# Patient Record
Sex: Male | Born: 1990 | Race: White | Hispanic: Yes | Marital: Single | Smoking: Former smoker
Health system: Southern US, Community
[De-identification: ages and names within clinical notes are randomized; demographics above are authoritative.]

---
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
## 2012-10-28 DIAGNOSIS — R079 Chest pain, unspecified: Secondary | ICD-10-CM

## 2021-03-03 ENCOUNTER — Emergency Department (HOSPITAL_COMMUNITY)
Admission: EM | Admit: 2021-03-03 | Discharge: 2021-03-04 | Disposition: A | Discharge status: Home / Self Care | Payer: Self-pay | Attending: Emergency Medicine | Admitting: Emergency Medicine

## 2021-03-03 ENCOUNTER — Other Ambulatory Visit: Payer: Self-pay

## 2021-03-03 ENCOUNTER — Encounter (HOSPITAL_COMMUNITY): Payer: Self-pay

## 2021-03-03 DIAGNOSIS — M545 Low back pain, unspecified: Secondary | ICD-10-CM | POA: Insufficient documentation

## 2021-03-03 DIAGNOSIS — B349 Viral infection, unspecified: Secondary | ICD-10-CM | POA: Insufficient documentation

## 2021-03-03 DIAGNOSIS — Z28310 Unvaccinated for covid-19: Secondary | ICD-10-CM | POA: Insufficient documentation

## 2021-03-03 DIAGNOSIS — Z87891 Personal history of nicotine dependence: Secondary | ICD-10-CM | POA: Insufficient documentation

## 2021-03-03 DIAGNOSIS — Z20822 Contact with and (suspected) exposure to covid-19: Secondary | ICD-10-CM | POA: Insufficient documentation

## 2021-03-03 LAB — BASIC METABOLIC PANEL
Anion gap: 8 (ref 5–15)
BUN: 13 mg/dL (ref 6–20)
CO2: 26 mmol/L (ref 22–32)
Calcium: 8.7 mg/dL — ABNORMAL LOW (ref 8.9–10.3)
Chloride: 101 mmol/L (ref 98–111)
Creatinine, Ser: 0.88 mg/dL (ref 0.61–1.24)
GFR, Estimated: >60 mL/min (ref >60)
Glucose, Bld: 98 mg/dL (ref 70–99)
Potassium: 3.6 mmol/L (ref 3.5–5.1)
Sodium: 135 mmol/L (ref 135–145)

## 2021-03-03 LAB — CBC WITH DIFFERENTIAL/PLATELET
Abs Immature Granulocytes: 0.03 10*3/uL (ref 0.00–0.07)
Basophils Absolute: 0 10*3/uL (ref 0.0–0.1)
Basophils Relative: 0 %
Eosinophils Absolute: 0 10*3/uL (ref 0.0–0.5)
Eosinophils Relative: 1 %
HCT: 43.3 % (ref 39.0–52.0)
Hemoglobin: 14.5 g/dL (ref 13.0–17.0)
Immature Granulocytes: 0 %
Lymphocytes Relative: 23 %
Lymphs Abs: 2 10*3/uL (ref 0.7–4.0)
MCH: 29.8 pg (ref 26.0–34.0)
MCHC: 33.5 g/dL (ref 30.0–36.0)
MCV: 88.9 fL (ref 80.0–100.0)
Monocytes Absolute: 1.4 10*3/uL — ABNORMAL HIGH (ref 0.1–1.0)
Monocytes Relative: 16 %
Neutro Abs: 5.2 10*3/uL (ref 1.7–7.7)
Neutrophils Relative %: 60 %
Platelets: 185 10*3/uL (ref 150–400)
RBC: 4.87 MIL/uL (ref 4.22–5.81)
RDW: 11.9 % (ref 11.5–15.5)
WBC: 8.6 10*3/uL (ref 4.0–10.5)
nRBC: 0 % (ref 0.0–0.2)

## 2021-03-03 LAB — LIPASE, BLOOD: Lipase: 30 U/L (ref 11–51)

## 2021-03-03 LAB — RESP PANEL BY RT-PCR (FLU A&B, COVID) ARPGX2
Influenza A by PCR: NEGATIVE
Influenza B by PCR: NEGATIVE
SARS Coronavirus 2 by RT PCR: NEGATIVE

## 2021-03-03 LAB — URINALYSIS, ROUTINE W REFLEX MICROSCOPIC
Bilirubin Urine: NEGATIVE
Glucose, UA: NEGATIVE mg/dL
Hgb urine dipstick: NEGATIVE
Ketones, ur: NEGATIVE mg/dL
Leukocytes,Ua: NEGATIVE
Nitrite: NEGATIVE
Protein, ur: NEGATIVE mg/dL
Specific Gravity, Urine: 1.023 (ref 1.005–1.030)
pH: 6 (ref 5.0–8.0)

## 2021-03-03 NOTE — ED Provider Notes (Signed)
°  Provider Note MRN:  [id number]  Arrival date & time: 03/04/21    ED Course and Medical Decision Making  Assumed care from Dr. Effie Shy at shift change.  URI symptoms as well as flank pain, favoring MSK but will evaluate with urinalysis, labs.  Would consider CT imaging for stone/Pilo.  Work-up is very reassuring, no leukocytosis, urinalysis is normal, based on history does seem most consistent with MSK.  Was gradual onset, doubt kidney stone, no blood in the urine, no signs of infection.  Patient is appropriate for discharge with reassurance.  Procedures  Final Clinical Impressions(s) / ED Diagnoses     ICD-10-CM   1. Acute right-sided low back pain without sciatica  M54.50     2. Viral illness  B34.9       ED Discharge Orders     None         Discharge Instructions      You were evaluated in the Emergency Department and after careful evaluation, we did not find any emergent condition requiring admission or further testing in the hospital.  Your exam/testing today was overall reassuring.  Symptoms seem to be due to a viral illness.  Recommend continued use of Tylenol or Motrin at home for discomfort.  Suspect she will start feeling better within the next 3 to 5 days.  Please return to the Emergency Department if you experience any worsening of your condition.  Thank you for allowing [place] to be a part of your care.       Elmer Sow. Pilar Plate, MD [facility] Emergency Medicine [facility] [email or web address]    Sabas Sous, MD 03/04/21 Lyda Jester

## 2021-03-03 NOTE — ED Triage Notes (Signed)
Pt arrived via POV from home c/o right flank pain and low grade fever at home X 1 week. Pt reports highest temp at home was 106F. Pts temp in Triage is 100.6F. Pt reports last taking Tylenol earlier this morning PTA. Pt reports flank pain is localized and non-radiating.

## 2021-03-03 NOTE — ED Provider Notes (Signed)
[facility] EMERGENCY DEPARTMENT Provider Note   CSN: [id number] Arrival date & time: 03/03/21  2047     History Chief Complaint  Patient presents with   Flank Pain    ESIQUIO [facility] is a 30 y.o. male.  HPI  Patient without medical history presents with chief complaint of right-sided lower back pain and fevers.  Patient states this started 1 week ago, started on Monday,  develop some right lower back pain, pain does not radiate, describes it as a dull-like sensation pain is worsened with movement improved with rest, he has no associate paresthesia/weakness lower extremities, denies urinary incontinency, urinary retention or difficulty with bowel movements, he does note that he has been peeing more frequently but denies dysuria hematuria or stomach pain.  Patient denies  history of kidney stones, no stomach surgeries or significant abdominal history.  He also notes that he started to have some nausea and vomiting that started on Monday, states that he only vomited  one time has not vomited since, he denies hematemesis or coffee-ground emesis, he states that he has felt slightly nauseous and has a decreased appetite but is still tolerating p.o.  He states that he has been having some fevers, chills, nasal congestion, a slightly sore throat and a productive cough, he denies  general body aches.  He is not immunocompromise, is not vaccine against COVID or influenza, denies any recent sick contacts, states in taking Tylenol with some relief.  History reviewed. No pertinent past medical history.  There are no problems to display for this patient.   History reviewed. No pertinent surgical history.     History reviewed. No pertinent family history.  Social History   Tobacco Use   Smoking status: Former    Types: Cigarettes   Smokeless tobacco: Never  Vaping Use   Vaping Use: Never used  Substance Use Topics   Alcohol use: Not Currently   Drug use: Not Currently    Home  Medications Prior to Admission medications   Not on File    Allergies    Patient has no allergy information on record.  Review of Systems   Review of Systems  Constitutional:  Positive for appetite change and chills. Negative for fever.  HENT:  Positive for congestion and sore throat.   Respiratory:  Positive for cough. Negative for shortness of breath.   Cardiovascular:  Negative for chest pain.  Gastrointestinal:  Positive for nausea. Negative for abdominal pain, constipation, diarrhea and vomiting.  Genitourinary:  Negative for enuresis.  Musculoskeletal:  Positive for back pain.  Skin:  Negative for rash.  Neurological:  Negative for dizziness and headaches.  Hematological:  Does not bruise/bleed easily.   Physical Exam Updated Vital Signs BP 125/82    Pulse 93    Temp 100 F (37.8 C) (Oral)    Resp 17    Ht 5\' 11"  (1.803 m)    Wt 95.3 kg    SpO2 99%    BMI 29.29 kg/m   Physical Exam Vitals and nursing note reviewed.  Constitutional:      General: He is not in acute distress.    Appearance: He is not ill-appearing.  HENT:     Head: Normocephalic and atraumatic.     Nose: Congestion present.     Mouth/Throat:     Mouth: Mucous membranes are moist.     Pharynx: Oropharynx is clear. No oropharyngeal exudate or posterior oropharyngeal erythema.  Eyes:     Conjunctiva/sclera: Conjunctivae normal.  Cardiovascular:     Rate and Rhythm: Normal rate and regular rhythm.     Pulses: Normal pulses.     Heart sounds: No murmur heard.   No friction rub. No gallop.  Pulmonary:     Effort: No respiratory distress.     Breath sounds: No wheezing, rhonchi or rales.  Abdominal:     Palpations: Abdomen is soft.     Tenderness: There is no abdominal tenderness. There is no right CVA tenderness or left CVA tenderness.  Musculoskeletal:     Comments: Spine was palpated was nontender to palpation, no step-off or deformities present, patient had very slight tenderness within the  musculature below his right 12th rib, no overlying skin changes, no CVA tenderness.  Patient has full range of motion, 5 of 5 strength, neurovascularly intact in lower extremities, negative straight leg raise.  Patient ambulating without difficulty.  Skin:    General: Skin is warm and dry.  Neurological:     Mental Status: He is alert.  Psychiatric:        Mood and Affect: Mood normal.    ED Results / Procedures / Treatments   Labs (all labs ordered are listed, but only abnormal results are displayed) Labs Reviewed  BASIC METABOLIC PANEL - Abnormal; Notable for the following components:      Result Value   Calcium 8.7 (*)    All other components within normal limits  CBC WITH DIFFERENTIAL/PLATELET - Abnormal; Notable for the following components:   Monocytes Absolute 1.4 (*)    All other components within normal limits  RESP PANEL BY RT-PCR (FLU A&B, COVID) [id number]  URINALYSIS, ROUTINE W REFLEX MICROSCOPIC  LIPASE, BLOOD    EKG None  Radiology No results found.  Procedures Procedures   Medications Ordered in ED Medications - No data to display  ED Course  I have reviewed the triage vital signs and the nursing notes.  Pertinent labs & imaging results that were available during my care of the patient were reviewed by me and considered in my medical decision making (see chart for details).    MDM Rules/Calculators/A&P                         Initial impression-presents with right-sided back pain as well as fevers and chills.  He is alert, no acute stress, vital signs notable for tachycardia.  Unclear etiology likely 2 separate etiologies muscular strain as well as URI will obtain basic lab work UA and reassess.  Work-up-CBC, lipase, BMP, UA all pending at this time.  Plan-due to shift change patient will be handed off to Dr. Pilar Plate  Follow-up on lab work, as long as UA is negative for signs infection or kidney stones, no increases creatinine and/or CBC will not recommend  CT renal at this time as I feel back pain is more muscular in nature.  Nausea vomiting fevers seem more consistent with URI, as long as patient remains nontoxic no leukocytosis, no abnormality seen in chemistries and is tolerating p.o. patient can be discharged home without further imaging.  If all negative patient can be discharged home with supportive measures follow-up if needed.     Final Clinical Impression(s) / ED Diagnoses Final diagnoses:  Acute right-sided low back pain without sciatica    Rx / DC Orders ED Discharge Orders     None        Barnie Del 03/03/21 2343    Mancel Bale, MD  03/04/21 1735 ° °

## 2021-03-04 NOTE — Discharge Instructions (Signed)
You were evaluated in the Emergency Department and after careful evaluation, we did not find any emergent condition requiring admission or further testing in the hospital.  Your exam/testing today was overall reassuring.  Symptoms seem to be due to a viral illness.  Recommend continued use of Tylenol or Motrin at home for discomfort.  Suspect she will start feeling better within the next 3 to 5 days.  Please return to the Emergency Department if you experience any worsening of your condition.  Thank you for allowing [place] to be a part of your care.

## 2022-08-11 ENCOUNTER — Other Ambulatory Visit: Payer: Self-pay

## 2022-08-11 ENCOUNTER — Emergency Department (HOSPITAL_COMMUNITY)
Admission: EM | Admit: 2022-08-11 | Discharge: 2022-08-11 | Disposition: A | Discharge status: Home / Self Care | Payer: Self-pay | Attending: Emergency Medicine | Admitting: Emergency Medicine

## 2022-08-11 ENCOUNTER — Encounter (HOSPITAL_COMMUNITY): Payer: Self-pay | Admitting: Emergency Medicine

## 2022-08-11 ENCOUNTER — Emergency Department (HOSPITAL_COMMUNITY): Payer: Self-pay

## 2022-08-11 DIAGNOSIS — M79642 Pain in left hand: Secondary | ICD-10-CM | POA: Insufficient documentation

## 2022-08-11 LAB — CBC WITH DIFFERENTIAL/PLATELET
Abs Immature Granulocytes: 0.03 10*3/uL (ref 0.00–0.07)
Basophils Absolute: 0 10*3/uL (ref 0.0–0.1)
Basophils Relative: 0 %
Eosinophils Absolute: 0.1 10*3/uL (ref 0.0–0.5)
Eosinophils Relative: 1 %
HCT: 47.9 % (ref 39.0–52.0)
Hemoglobin: 16.2 g/dL (ref 13.0–17.0)
Immature Granulocytes: 0 %
Lymphocytes Relative: 22 %
Lymphs Abs: 2.2 10*3/uL (ref 0.7–4.0)
MCH: 29.9 pg (ref 26.0–34.0)
MCHC: 33.8 g/dL (ref 30.0–36.0)
MCV: 88.5 fL (ref 80.0–100.0)
Monocytes Absolute: 0.7 10*3/uL (ref 0.1–1.0)
Monocytes Relative: 7 %
Neutro Abs: 6.9 10*3/uL (ref 1.7–7.7)
Neutrophils Relative %: 70 %
Platelets: 213 10*3/uL (ref 150–400)
RBC: 5.41 MIL/uL (ref 4.22–5.81)
RDW: 12 % (ref 11.5–15.5)
WBC: 9.9 10*3/uL (ref 4.0–10.5)
nRBC: 0 % (ref 0.0–0.2)

## 2022-08-11 LAB — BASIC METABOLIC PANEL
Anion gap: 13 (ref 5–15)
BUN: 9 mg/dL (ref 6–20)
CO2: 22 mmol/L (ref 22–32)
Calcium: 9.2 mg/dL (ref 8.9–10.3)
Chloride: 99 mmol/L (ref 98–111)
Creatinine, Ser: 0.89 mg/dL (ref 0.61–1.24)
GFR, Estimated: >60 mL/min (ref >60)
Glucose, Bld: 103 mg/dL — ABNORMAL HIGH (ref 70–99)
Potassium: 4.1 mmol/L (ref 3.5–5.1)
Sodium: 134 mmol/L — ABNORMAL LOW (ref 135–145)

## 2022-08-11 LAB — TROPONIN I (HIGH SENSITIVITY): Troponin I (High Sensitivity): <2 ng/L (ref <18)

## 2022-08-11 NOTE — Discharge Instructions (Signed)
Please follow-up with your primary care provider regarding recent symptoms and ER visit.  I have attached a primary care provider for you to follow-up with if you do not have one.  Today your labs and imaging were all reassuring. Please monitor your symptoms and if symptoms worsen please return to ER.

## 2022-08-11 NOTE — ED Notes (Signed)
PA-C would like troponin drawn, phlebotomy called and phlebotomist stated that they will come and draw lab

## 2022-08-11 NOTE — ED Notes (Signed)
Called phlebotomy pertaining to troponin, it has been collected and they are running it, awaiting results

## 2022-08-11 NOTE — ED Triage Notes (Signed)
Pt reporting left hand numbness intermittently for several months with occasional radiation up to left pectoral area. Pt reports family hx of heart problems. No personal significant medical history. 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

## 2022-08-11 NOTE — ED Provider Notes (Addendum)
[facility] Provider Note   CSN: [id number] Arrival date & time: 08/11/22  1557     History  Chief Complaint  Patient presents with   Hand Problem    [facility] is a 32 y.o. male with no past medical problems presented with left hand pain that has been present for the past year.  Patient states that he does work in [employer] his first 3 digits on his left fingers go numb for moments at a time.  Patient also notes this when he wakes up in the morning.  Patient states he was drinking alcohol last night and woke up feeling hung over and at the same paresthesias.  Patient denies any weakness, decreased sensation.  Patient does state he is concerned that his hand is not perfusing as his dad recently had heart surgery and he is concerned for the same.  Patient states that he notices paresthesias rating from his left elbow to his first left digits and sometimes from his left shoulder.  Patient does note that he sleeps on his left side a lot.  Patient denies chest pain, shortness of breath, and neck trauma/neck pain  Home Medications Prior to Admission medications   Not on File      Allergies    Patient has no known allergies.    Review of Systems   Review of Systems See HPI Physical Exam Updated Vital Signs BP (!) 148/90 (BP Location: Right Arm)   Pulse 78   Temp 98.8 F (37.1 C) (Oral)   Resp 13   Ht 5\' 11"  (1.803 m)   Wt 95.3 kg   SpO2 100%   BMI 29.29 kg/m  Physical Exam Constitutional:      General: He is not in acute distress. Cardiovascular:     Rate and Rhythm: Normal rate and regular rhythm.     Pulses: Normal pulses.     Heart sounds: Normal heart sounds.     Comments: 2+ bilateral radial pulses with regular rate Pulmonary:     Effort: Pulmonary effort is normal. No respiratory distress.     Breath sounds: Normal breath sounds.  Musculoskeletal:     Cervical back: Normal range of motion and neck  supple. No tenderness.     Comments: Left wrist: 5 out of 5 wrist flexion/extension grip, elbow flexion/extension No step-off/crepitus/or mass palpated Negative Tinel's/Phalen sign No swelling noted Arms do not appear to be edematous Soft compartments  Skin:    General: Skin is warm and dry.     Capillary Refill: Capillary refill takes less than 2 seconds.     Comments: No overlying skin color change  Neurological:     Mental Status: He is alert.     Comments: Sensation intact distally     ED Results / Procedures / Treatments   Labs (all labs ordered are listed, but only abnormal results are displayed) Labs Reviewed  BASIC METABOLIC PANEL - Abnormal; Notable for the following components:      Result Value   Sodium 134 (*)    Glucose, Bld 103 (*)    All other components within normal limits  CBC WITH DIFFERENTIAL/PLATELET  TROPONIN I (HIGH SENSITIVITY)  TROPONIN I (HIGH SENSITIVITY)    EKG EKG Interpretation  Date/Time:  Sunday Aug 11 2022 17:21:43 EDT Ventricular Rate:  76 PR Interval:  149 QRS Duration: 103 QT Interval:  382 QTC Calculation: 430 R Axis:   69 Text Interpretation: Sinus rhythm  ST elev, probable normal early repol pattern No acute changes No significant change since last tracing Confirmed by Derwood Kaplan [phone]) on 08/11/2022 6:22:16 PM  Radiology DG Chest Port 1 View  Result Date: 08/11/2022 CLINICAL DATA:  Chest pain. EXAM: PORTABLE CHEST 1 VIEW COMPARISON:  None Available. FINDINGS: The heart size and mediastinal contours are within normal limits. Both lungs are clear. The visualized skeletal structures are unremarkable. IMPRESSION: No active disease. Electronically Signed   By: Ted Mcalpine M.D.   On: 08/11/2022 17:44    Procedures Procedures    Medications Ordered in ED Medications - No data to display  ED Course/ Medical Decision Making/ A&P                             Medical Decision Making Amount and/or Complexity of Data  Reviewed Labs: ordered. Radiology: ordered.   [facility] 33 y.o. presented today for left hand paresthesias. Working DDx that I considered at this time includes, but not limited to, peripheral neuropathy, nerve palsy, DVT, arterial emboli, fracture, compartment syndrome, neurovascular compromise, ACS, asthma, PE.  R/o DDx: DVT, arterial emboli, fracture, compartment syndrome, neurovascular compromise, ACS, asthma, PE: These are considered less likely due to history of present illness and physical exam findings  Review of prior external notes: 03/03/2021 ED  Unique Tests and My Interpretation:  CBC: Unremarkable BMP: Unremarkable Chest x-ray: No acute cardiopulmonary changes Troponin: Less than 2 EKG: sinus without blocks or ST abnormalities noted  Discussion with Independent Historian: None  Discussion of Management of Tests: None  Risk: Low: based on diagnostic testing/clinical impression and treatment plan  Risk Stratification Score: PERC 0  Plan: Patient presented for left hand paresthesia. On exam patient was in no acute distress and stable vitals.  Patient's physical exam was unremarkable.  Patient is concerned that his hand is not perfusing well however he demonstrated that his capillary refills less than 2 seconds.  Patient notes that he works in [employer] and often uses machines with a lot of vibration which may be contributing to patient's symptoms.  Patient also notes that he sleeps on his left side a lot and that he notices paresthesias after he wakes up from naps.  I highly suspect patient has a reversible nerve palsy due to reported when he sleeps and with his overall reassuring exam.  Patient not endorse any chest pain or shortness of breath or any other red flag symptoms.  Patient good pulse motor sensation and good cap refill.  Patient stated they wanted basic blood labs drawn and so BMP and CBC will be ordered.  Anticipate discharge with primary care follow-up.   Patient stable at this time.  Patient's BMP and CBC are reassuring.  I want to go discharge patient patient stated that he was starting to have shortness of breath and that this morning he had chest pain.  Chest x-ray will be obtained along with troponin.  Only 1 troponin will be needed as patient had chest pain this morning and it has been more than 4 hours.  Patient stable this time.  Patient's troponin and chest x-ray are reassuring.  Patient will be given primary care follow-up and discharge.  Patient was given return precautions. Patient stable for discharge at this time.  Patient verbalized understanding of plan.         Final Clinical Impression(s) / ED Diagnoses Final diagnoses:  Left hand pain    Rx / DC  Orders ED Discharge Orders     None        Remi Deter 08/11/22 1851    Derwood Kaplan, MD 08/16/22 [phone]

## 2022-11-13 ENCOUNTER — Ambulatory Visit
Admission: EM | Admit: 2022-11-13 | Discharge: 2022-11-13 | Disposition: A | Discharge status: Home / Self Care | Payer: Medicaid Other | Attending: Family Medicine | Admitting: Family Medicine

## 2022-11-13 DIAGNOSIS — R1084 Generalized abdominal pain: Secondary | ICD-10-CM | POA: Diagnosis not present

## 2022-11-13 DIAGNOSIS — R197 Diarrhea, unspecified: Secondary | ICD-10-CM | POA: Insufficient documentation

## 2022-11-13 MED ORDER — AZITHROMYCIN 250 MG PO TABS: ORAL_TABLET | ORAL | 0 refills | Status: DC

## 2022-11-13 MED ORDER — LOPERAMIDE HCL 2 MG PO CAPS: 2.0000 mg | ORAL_CAPSULE | Freq: Four times a day (QID) | ORAL | 0 refills | Status: DC | PRN

## 2022-11-13 NOTE — ED Triage Notes (Addendum)
Pt c/o abdominal pain and diarrhea x 2 weeks. Stool has been yellow for the past week. Right flank pain.    Pt has brecently been on antibiotics for a bad tooth.

## 2022-11-13 NOTE — ED Provider Notes (Signed)
[facility]    CSN: [id number] Arrival date & time: 11/13/22  1601      History   Chief Complaint No chief complaint on file.   HPI Edgar Parker is a 32 y.o. male.   Patient presenting today with about 2 weeks of generalized abdominal pain, yellow frequent diarrhea which he states he is going at least 3 times daily.  Denies fever, chills, nausea, vomiting, upper respiratory symptoms, new foods, recent travel outside the country, sick contacts.  Recently completed a course of antibiotics after a root canal, these ended about a week and a half ago.  Symptoms started while taking the last few doses of this.  So far not trying anything over-the-counter for the symptoms other than ibuprofen which he stopped wondering if his abdominal pain was related to this.  The symptoms did not improve after stopping.  Denies any known chronic GI issues.    History reviewed. No pertinent past medical history.  There are no problems to display for this patient.   History reviewed. No pertinent surgical history.     Home Medications    Prior to Admission medications   Medication Sig Start Date End Date Taking? Authorizing Provider  azithromycin (ZITHROMAX) 250 MG tablet Take first 2 tablets together, then 1 every day until finished. 11/13/22  Yes Particia Nearing, PA-C  loperamide (IMODIUM) 2 MG capsule Take 1 capsule (2 mg total) by mouth 4 (four) times daily as needed for diarrhea or loose stools. 11/13/22  Yes Particia Nearing, PA-C    Family History History reviewed. No pertinent family history.  Social History Social History   Tobacco Use   Smoking status: Former    Types: Cigarettes   Smokeless tobacco: Never  Vaping Use   Vaping status: Never Used  Substance Use Topics   Alcohol use: Not Currently   Drug use: Not Currently     Allergies   Patient has no known allergies.   Review of Systems Review of Systems Per HPI  Physical Exam Triage  Vital Signs ED Triage Vitals  Encounter Vitals Group     BP 11/13/22 1655 119/80     Systolic BP Percentile --      Diastolic BP Percentile --      Pulse Rate 11/13/22 1655 80     Resp 11/13/22 1655 13     Temp 11/13/22 1655 99.3 F (37.4 C)     Temp Source 11/13/22 1655 Oral     SpO2 11/13/22 1655 96 %     Weight --      Height --      Head Circumference --      Peak Flow --      Pain Score 11/13/22 1658 7     Pain Loc --      Pain Education --      Exclude from Growth Chart --    No data found.  Updated Vital Signs BP 119/80 (BP Location: Right Arm)   Pulse 80   Temp 99.3 F (37.4 C) (Oral)   Resp 13   SpO2 96%   Visual Acuity Right Eye Distance:   Left Eye Distance:   Bilateral Distance:    Right Eye Near:   Left Eye Near:    Bilateral Near:     Physical Exam Vitals and nursing note reviewed.  Constitutional:      Appearance: Normal appearance.  HENT:     Head: Atraumatic.     Mouth/Throat:  Mouth: Mucous membranes are moist.     Pharynx: Oropharynx is clear.  Eyes:     Extraocular Movements: Extraocular movements intact.     Conjunctiva/sclera: Conjunctivae normal.  Cardiovascular:     Rate and Rhythm: Normal rate and regular rhythm.  Pulmonary:     Effort: Pulmonary effort is normal.     Breath sounds: Normal breath sounds.  Abdominal:     General: Bowel sounds are normal. There is no distension.     Palpations: Abdomen is soft.     Tenderness: There is no abdominal tenderness. There is no right CVA tenderness, left CVA tenderness or guarding.  Musculoskeletal:        General: Normal range of motion.     Cervical back: Normal range of motion and neck supple.  Skin:    General: Skin is warm and dry.  Neurological:     General: No focal deficit present.     Mental Status: He is oriented to person, place, and time.  Psychiatric:        Mood and Affect: Mood normal.        Thought Content: Thought content normal.        Judgment: Judgment  normal.      UC Treatments / Results  Labs (all labs ordered are listed, but only abnormal results are displayed) Labs Reviewed  C DIFFICILE QUICK SCREEN W PCR REFLEX    GASTROINTESTINAL PANEL BY PCR, STOOL (REPLACES STOOL CULTURE)  COMPREHENSIVE METABOLIC PANEL  CBC WITH DIFFERENTIAL/PLATELET  LIPASE    EKG   Radiology No results found.  Procedures Procedures (including critical care time)  Medications Ordered in UC Medications - No data to display  Initial Impression / Assessment and Plan / UC Course  I have reviewed the triage vital signs and the nursing notes.  Pertinent labs & imaging results that were available during my care of the patient were reviewed by me and considered in my medical decision making (see chart for details).     Discussed need for stool studies as diarrhea has been ongoing for greater than a week and discussed risk of C. difficile given recent antibiotic use.  Will test stool for both, and patient is very concerned about his pancreas so we will also obtain some basic labs, lipase.  Given concern for infectious diarrhea, will start Zithromax, Imodium and discussed electrolytes and fluids, brat diet.  Return for worsening symptoms.  Final Clinical Impressions(s) / UC Diagnoses   Final diagnoses:  Generalized abdominal pain  Diarrhea, unspecified type   Discharge Instructions   None    ED Prescriptions     Medication Sig Dispense Auth. Provider   loperamide (IMODIUM) 2 MG capsule Take 1 capsule (2 mg total) by mouth 4 (four) times daily as needed for diarrhea or loose stools. 12 capsule Particia Nearing, PA-C   azithromycin (ZITHROMAX) 250 MG tablet Take first 2 tablets together, then 1 every day until finished. 6 tablet Particia Nearing, [place]      PDMP not reviewed this encounter.   Particia Nearing, [place] 11/13/22 1739

## 2022-11-14 LAB — COMPREHENSIVE METABOLIC PANEL
ALT: 17 IU/L (ref 0–44)
AST: 17 IU/L (ref 0–40)
Albumin: 4.8 g/dL (ref 4.3–5.2)
Alkaline Phosphatase: 103 IU/L (ref 44–121)
BUN/Creatinine Ratio: 10 (ref 9–20)
BUN: 11 mg/dL (ref 6–20)
Bilirubin Total: 0.5 mg/dL (ref 0.0–1.2)
CO2: 22 mmol/L (ref 20–29)
Calcium: 9.5 mg/dL (ref 8.7–10.2)
Chloride: 102 mmol/L (ref 96–106)
Creatinine, Ser: 1.07 mg/dL (ref 0.76–1.27)
Globulin, Total: 2.4 g/dL (ref 1.5–4.5)
Glucose: 92 mg/dL (ref 70–99)
Potassium: 4 mmol/L (ref 3.5–5.2)
Sodium: 139 mmol/L (ref 134–144)
Total Protein: 7.2 g/dL (ref 6.0–8.5)
eGFR: 96 mL/min/{1.73_m2} (ref >59)

## 2022-11-14 LAB — CBC WITH DIFFERENTIAL/PLATELET
Basophils Absolute: 0 10*3/uL (ref 0.0–0.2)
Basos: 0 %
EOS (ABSOLUTE): 0.1 10*3/uL (ref 0.0–0.4)
Eos: 1 %
Hematocrit: 45 % (ref 37.5–51.0)
Hemoglobin: 15.6 g/dL (ref 13.0–17.7)
Immature Grans (Abs): 0 10*3/uL (ref 0.0–0.1)
Immature Granulocytes: 0 %
Lymphocytes Absolute: 1.9 10*3/uL (ref 0.7–3.1)
Lymphs: 18 %
MCH: 30.2 pg (ref 26.6–33.0)
MCHC: 34.7 g/dL (ref 31.5–35.7)
MCV: 87 fL (ref 79–97)
Monocytes Absolute: 0.6 10*3/uL (ref 0.1–0.9)
Monocytes: 5 %
Neutrophils Absolute: 8 10*3/uL — ABNORMAL HIGH (ref 1.4–7.0)
Neutrophils: 76 %
Platelets: 219 10*3/uL (ref 150–450)
RBC: 5.17 x10E6/uL (ref 4.14–5.80)
RDW: 12.3 % (ref 11.6–15.4)
WBC: 10.6 10*3/uL (ref 3.4–10.8)

## 2022-11-14 LAB — LIPASE: Lipase: 23 U/L (ref 13–78)

## 2022-11-17 LAB — C DIFFICILE QUICK SCREEN W PCR REFLEX
C Diff antigen: NEGATIVE
C Diff interpretation: NOT DETECTED
C Diff toxin: NEGATIVE

## 2022-11-18 ENCOUNTER — Telehealth: Payer: Self-pay

## 2022-11-18 LAB — GASTROINTESTINAL PANEL BY PCR, STOOL (REPLACES STOOL CULTURE)

## 2022-11-18 NOTE — Telephone Encounter (Signed)
After the lab called to relay pts results pt was notified of the E coli that was found in his GI panel. Pt already was sent home with loperamide and azithromycin x 5 days ago. Per provider, pt should get better with time. If pt is not feeling better by the end of this week come back and be seen again. Pt verbalized understanding of this .

## 2023-03-06 ENCOUNTER — Ambulatory Visit
Admission: EM | Admit: 2023-03-06 | Discharge: 2023-03-06 | Disposition: A | Discharge status: Home / Self Care | Payer: Medicaid Other | Attending: Nurse Practitioner | Admitting: Nurse Practitioner

## 2023-03-06 DIAGNOSIS — R197 Diarrhea, unspecified: Secondary | ICD-10-CM | POA: Insufficient documentation

## 2023-03-06 MED ORDER — AZITHROMYCIN 250 MG PO TABS
1000.0000 mg | ORAL_TABLET | Freq: Once | ORAL | 0 refills | Status: AC
Start: 2023-03-06 — End: 2023-03-06

## 2023-03-06 NOTE — ED Triage Notes (Signed)
Reports he is having runny stool that is loose and lower abdominal pain like when he had e coli x 1 week.

## 2023-03-06 NOTE — Discharge Instructions (Addendum)
Take the azithromycin as prescribed to treat for possible infectious cause.  Continue plenty of hydration with water.  Return the stool sample as soon as you are able to we can test it.  If symptoms do not improve with treatment, recommend follow up with GI.

## 2023-03-06 NOTE — ED Provider Notes (Signed)
[facility]    CSN: [id number] Arrival date & time: 03/06/23  1311      History   Chief Complaint No chief complaint on file.   HPI Edgar Parker is a 32 y.o. male.   Patient presents today for approximately 2-week history of generalized abdominal pain that he describes as crampy abdominal pain that comes on suddenly and last for couple of hours.  When the pain comes on, he rates the pain as a 6 out of 10, no pain at times.  He also endorses loose, jagged, yellow stools 2-3 times daily for the past couple of weeks and had similar bowel movements when he had E. coli in his stool a few months ago.  He denies fever, vomiting, however is a little bit nauseous in the morning sometimes.  No change in appetite, unexplained weight loss, constipation, blood in the stool, heartburn, new rash, or urinary symptoms including burning with urination, hematuria.  Since he had E. coli in his stool, he changed his diet drastically and stopped eating fast food, is drinking lots of water, stop drinking alcohol, and is not eating or ingesting any red or yellow dyes.  He was very nervous about his pancreas or gallbladder being "off" as well and is requesting testing of that today.  Does not currently have a primary care provider.  Patient denies recent antibiotic use other than the azithromycin he was treated with back in August.  He denies recent suspicious drinking water ingestion, recent foreign travel, or known contacts with similar symptoms.    History reviewed. No pertinent past medical history.  There are no active problems to display for this patient.   History reviewed. No pertinent surgical history.     Home Medications    Prior to Admission medications   Not on File    Family History History reviewed. No pertinent family history.  Social History Social History   Tobacco Use   Smoking status: Former    Types: Cigarettes   Smokeless tobacco: Never  Vaping Use    Vaping status: Never Used  Substance Use Topics   Alcohol use: Not Currently   Drug use: Not Currently     Allergies   Patient has no known allergies.   Review of Systems Review of Systems Per HPI  Physical Exam Triage Vital Signs ED Triage Vitals  Encounter Vitals Group     BP 03/06/23 1336 127/82     Systolic BP Percentile --      Diastolic BP Percentile --      Pulse Rate 03/06/23 1336 69     Resp 03/06/23 1336 16     Temp 03/06/23 1336 98.5 F (36.9 C)     Temp Source 03/06/23 1336 Oral     SpO2 03/06/23 1336 97 %     Weight --      Height --      Head Circumference --      Peak Flow --      Pain Score 03/06/23 1337 5     Pain Loc --      Pain Education --      Exclude from Growth Chart --    No data found.  Updated Vital Signs BP 127/82 (BP Location: Right Arm)   Pulse 69   Temp 98.5 F (36.9 C) (Oral)   Resp 16   SpO2 97%   Visual Acuity Right Eye Distance:   Left Eye Distance:   Bilateral Distance:  Right Eye Near:   Left Eye Near:    Bilateral Near:     Physical Exam Vitals and nursing note reviewed.  Constitutional:      General: He is not in acute distress.    Appearance: Normal appearance. He is not toxic-appearing.  HENT:     Head: Normocephalic and atraumatic.     Mouth/Throat:     Mouth: Mucous membranes are moist.     Pharynx: Oropharynx is clear. No posterior oropharyngeal erythema.  Cardiovascular:     Rate and Rhythm: Normal rate and regular rhythm.  Pulmonary:     Effort: Pulmonary effort is normal. No respiratory distress.     Breath sounds: Normal breath sounds. No wheezing, rhonchi or rales.  Abdominal:     General: Abdomen is flat. Bowel sounds are normal. There is no distension.     Palpations: Abdomen is soft.     Tenderness: There is no abdominal tenderness. There is no right CVA tenderness, left CVA tenderness, guarding or rebound.  Musculoskeletal:     Cervical back: Normal range of motion.  Lymphadenopathy:      Cervical: No cervical adenopathy.  Skin:    General: Skin is warm and dry.     Capillary Refill: Capillary refill takes less than 2 seconds.     Coloration: Skin is not jaundiced or pale.     Findings: No erythema.  Neurological:     Mental Status: He is alert.     Motor: No weakness.     Gait: Gait normal.  Psychiatric:        Behavior: Behavior is cooperative.      UC Treatments / Results  Labs (all labs ordered are listed, but only abnormal results are displayed) Labs Reviewed  LIPASE   Narrative:    Performed at:  [address] [place] [address] [place], [place]  [id number] Lab Director: Jolene Schimke MD, Phone:  [phone]  COMPREHENSIVE METABOLIC PANEL   Narrative:    Performed at:  [address], [place], [place]  [id number] Lab Director: Jolene Schimke MD, Phone:  [phone]    EKG   Radiology No results found.  Procedures Procedures (including critical care time)  Medications Ordered in UC Medications - No data to display  Initial Impression / Assessment and Plan / UC Course  I have reviewed the triage vital signs and the nursing notes.  Pertinent labs & imaging results that were available during my care of the patient were reviewed by me and considered in my medical decision making (see chart for details).   Patient is well-appearing, normotensive, afebrile, not tachycardic, not tachypneic, oxygenating well on room air.    1. Diarrhea, unspecified type Will perform repeat stool testing if patient is able to bring [place] a sample In meantime, treat with azithromycin 1 g once to cover for E. coli in the stool Will also obtain blood work to check pancreas, gallbladder, liver, electrolytes, kidney function per patient's request Recommended hydration plenty of fluids, bland diet next couple of days Strict ER precautions discussed with patient Also recommended follow-up with gastroenterology if symptoms persist and all testing  is negative or if symptoms recur  The patient was given the opportunity to ask questions.  All questions answered to their satisfaction.  The patient is in agreement to this plan.   Final Clinical Impressions(s) / UC Diagnoses   Final diagnoses:  Diarrhea, unspecified type     Discharge Instructions  Take the azithromycin as prescribed to treat for possible infectious cause.  Continue plenty of hydration with water.  Return the stool sample as soon as you are able to we can test it.  If symptoms do not improve with treatment, recommend follow up with GI.     ED Prescriptions     Medication Sig Dispense Auth. Provider   azithromycin (ZITHROMAX) 250 MG tablet Take 4 tablets (1,000 mg total) by mouth once for 1 dose. Take first 2 tablets together, then 1 every day until finished. 4 tablet Valentino Nose, NP      PDMP not reviewed this encounter.   Valentino Nose, NP 03/07/23 [phone]

## 2023-03-07 LAB — COMPREHENSIVE METABOLIC PANEL
ALT: 14 [IU]/L (ref 0–44)
AST: 18 [IU]/L (ref 0–40)
Albumin: 4.8 g/dL (ref 4.3–5.2)
Alkaline Phosphatase: 108 [IU]/L (ref 44–121)
BUN/Creatinine Ratio: 14 (ref 9–20)
BUN: 12 mg/dL (ref 6–20)
Bilirubin Total: 0.3 mg/dL (ref 0.0–1.2)
CO2: 23 mmol/L (ref 20–29)
Calcium: 9.6 mg/dL (ref 8.7–10.2)
Chloride: 102 mmol/L (ref 96–106)
Creatinine, Ser: 0.88 mg/dL (ref 0.76–1.27)
Globulin, Total: 2.7 g/dL (ref 1.5–4.5)
Glucose: 72 mg/dL (ref 70–99)
Potassium: 4.4 mmol/L (ref 3.5–5.2)
Sodium: 140 mmol/L (ref 134–144)
Total Protein: 7.5 g/dL (ref 6.0–8.5)
eGFR: 119 mL/min/{1.73_m2} (ref >59)

## 2023-03-07 LAB — LIPASE: Lipase: 27 U/L (ref 13–78)

## 2023-03-08 LAB — GASTROINTESTINAL PANEL BY PCR, STOOL (REPLACES STOOL CULTURE)

## 2023-03-11 NOTE — Progress Notes (Unsigned)
Referring Provider: Valentino Nose, NP (urgent care) Primary Care Physician:  Pcp, No Primary Gastroenterologist:  Dr. Jena Gauss  Chief Complaint  Patient presents with   Diarrhea    Diarrhea all the time. Had E.Coli a couple months ago thinks he may still have it.     HPI:   Edgar Parker is a 32 y.o. male presenting today at the request of Valentino Nose, NP for diarrhea.  Patient was seen 03/06/2023 at [facility] urgent care reporting approximately 2 weeks of generalized abdominal pain described as crampy in nature that would come on suddenly and last for couple of hours.  Also with loose, jagged, yellow stools, 2-3 times a day for the last couple of weeks and reported similar symptoms when he had E. coli a few months ago.  He denied any recent antibiotics aside from azithromycin he was treated with back in August.  No recent suspicious drinking water ingestion, foreign travel, known contacts with similar symptoms.  CMP and lipase were within normal limits. He was treated empirically with azithromycin 1 g x 1 to cover for E. coli.  GI path panel was performed, but resulted the next day and was entirely normal.  He was advised to follow-up with GI if symptoms do not improve.   Today: Symptoms of diarrhea and abdominal started in August. Tested positive for E coli. Treated with antibiotics. Symptoms faded, but continued to come and go. Flared again 2 weeks ago and has been persistent. Not able to just ignore it anymore.   Having 1-2 Bms per day. Mushy to watery. [place] 5-6. No [place] 4 since August.  When he had E. coli, he was having numerous bowel movements per day.  No brbpr or melena. Reports stool is yellow. Having pain along the sides of his abdomen and lower abdomen and sometimes in epigastric area. Gets very bloated after eating and has worsening RUQ pain after meals. Gets full quickly. Has been losing weight but states some of this is due to eating better. Not going out to  eat since having E coli. Sometimes with nausea but no vomiting. Nausea is usually after eating. If eating greasy foods, he will feel worse. Pain is 4-5/10 in severity.   After a bowel movement, pain improves sometimes, but doesn't resolve.   Urine looks different than normal as well. Looks foamy/cloudy. No pain with urination, but is urinating more frequently.   Used to have heartburn, but not in a while.   NSAIDs: None.    Quit drinking alcohol and smoking tobacco in February.    No medications.  No recent antibiotics.   Did not take antibiotic prescribed by the ER.  No prior EGD or colonoscopy.    History reviewed. No pertinent past medical history.  History reviewed. No pertinent surgical history.  No current outpatient medications on file.   No current facility-administered medications for this visit.    Allergies as of 03/13/2023   (No Known Allergies)    Family History  Problem Relation Age of Onset   Colon cancer Neg Hx    Inflammatory bowel disease Neg Hx     Social History   Socioeconomic History   Marital status: Single    Spouse name: Not on file   Number of children: Not on file   Years of education: Not on file   Highest education level: Not on file  Occupational History   Not on file  Tobacco Use   Smoking status: Former  Types: Cigarettes   Smokeless tobacco: Never  Vaping Use   Vaping status: Never Used  Substance and Sexual Activity   Alcohol use: Not Currently    Comment: Stopped 04/2022   Drug use: Not Currently   Sexual activity: Not Currently  Other Topics Concern   Not on file  Social History Narrative   Not on file   Social Drivers of Health   Financial Resource Strain: Not on file  Food Insecurity: Not on file  Transportation Needs: Not on file  Physical Activity: Not on file  Stress: Not on file  Social Connections: Not on file  Intimate Partner Violence: Not on file    Review of Systems: Gen: Denies any fever,  chills, cold or flu like symptoms, pre-syncope, or syncope.  CV: Denies chest pain, heart palpitations.  Resp: Denies shortness of breath, cough. GI: See HPI GU : See HPI MS: Denies joint pain, muscle weakness, cramps, or limitation of movement.  Derm: Denies rash. Psych: Denies depression, anxiety. Heme: See HPI  Physical Exam: BP 121/78 (BP Location: Right Arm, Patient Position: Sitting, Cuff Size: Large)   Pulse 70   Temp 97.9 F (36.6 C) (Temporal)   Ht 5\' 11"  (1.803 m)   Wt 218 lb 6.4 oz (99.1 kg)   BMI 30.46 kg/m  General:   Alert and oriented. Pleasant and cooperative. Well-nourished and well-developed.  Head:  Normocephalic and atraumatic. Eyes:  Without icterus, sclera clear and conjunctiva pink.  Ears:  Normal auditory acuity. Lungs:  Clear to auscultation bilaterally. No wheezes, rales, or rhonchi. No distress.  Heart:  S1, S2 present without murmurs appreciated.  Abdomen:  +BS, soft, and non-distended.  Moderate tenderness to palpation in the epigastric and RUQ region, mild TTP in right lower quadrant, left lower quadrant, suprapubic region.  No HSM noted. No guarding or rebound. No masses appreciated.  Rectal:  Deferred  Msk:  Symmetrical without gross deformities. Normal posture. Extremities:  Without edema. Neurologic:  Alert and  oriented x4;  grossly normal neurologically. Skin:  Intact without significant lesions or rashes. Psych: Normal mood and affect.    Assessment:  31 year old male with history of E. coli infection in August 2024, presenting today for further evaluation of abdominal pain and change in bowel habits with diarrhea.  Symptoms initially started in August when he was diagnosed with E. coli.  He was treated with azithromycin and reports improvement in diarrhea and abdominal pain, but symptoms never resolved and seem to be worsening over the last couple of weeks.  Evaluation in the ER on 12/19 for the same with no significant abnormalities on CMP or  lipase.  GI panel was normal.    Differential is broad and patient has essentially generalized abdominal pain.  I do not think we are dealing with an infectious diarrhea as he is only having 1 or 2 bowel movements a day.  As he reports increased RUQ abdominal pain postprandially with some associated nausea, query biliary/gallbladder etiology.  Could also have PUD/gastritis/duodenitis contributing to upper abdominal pain. On exam, he also has tenderness in the right lower quadrant, left lower quadrant, and suprapubic region.  Additional differentials include diverticulitis, colitis/IBD.  Less likely appendicitis.   We will update labs, check inflammatory markers, and obtain a CT of his abdomen pelvis to further evaluate his symptoms.  I am also ordering urine analysis as he reports increased urinary frequency and foamy urine.    Plan:  CBC, CMP, lipase, CRP, sed rate, UA with reflex  to culture. CT A/P with oral and IV contrast ASAP Further recommendations to follow.   Ermalinda Memos, PA-C [facility] 03/13/2023

## 2023-03-11 NOTE — H&P (View-Only) (Signed)
 Referring Provider: Valentino Nose, NP (urgent care) Primary Care Physician:  Pcp, No Primary Gastroenterologist:  Dr. Jena Gauss  Chief Complaint  Patient presents with   Diarrhea    Diarrhea all the time. Had E.Coli a couple months ago thinks he may still have it.     HPI:   Edgar Parker is a 32 y.o. male presenting today at the request of Valentino Nose, NP for diarrhea.  Patient was seen 03/06/2023 at [facility] urgent care reporting approximately 2 weeks of generalized abdominal pain described as crampy in nature that would come on suddenly and last for couple of hours.  Also with loose, jagged, yellow stools, 2-3 times a day for the last couple of weeks and reported similar symptoms when he had E. coli a few months ago.  He denied any recent antibiotics aside from azithromycin he was treated with back in August.  No recent suspicious drinking water ingestion, foreign travel, known contacts with similar symptoms.  CMP and lipase were within normal limits. He was treated empirically with azithromycin 1 g x 1 to cover for E. coli.  GI path panel was performed, but resulted the next day and was entirely normal.  He was advised to follow-up with GI if symptoms do not improve.   Today: Symptoms of diarrhea and abdominal started in August. Tested positive for E coli. Treated with antibiotics. Symptoms faded, but continued to come and go. Flared again 2 weeks ago and has been persistent. Not able to just ignore it anymore.   Having 1-2 Bms per day. Mushy to watery. Bristol 5-6. No Bristol 4 since August.  When he had E. coli, he was having numerous bowel movements per day.  No brbpr or melena. Reports stool is yellow. Having pain along the sides of his abdomen and lower abdomen and sometimes in epigastric area. Gets very bloated after eating and has worsening RUQ pain after meals. Gets full quickly. Has been losing weight but states some of this is due to eating better. Not going out to  eat since having E coli. Sometimes with nausea but no vomiting. Nausea is usually after eating. If eating greasy foods, he will feel worse. Pain is 4-5/10 in severity.   After a bowel movement, pain improves sometimes, but doesn't resolve.   Urine looks different than normal as well. Looks foamy/cloudy. No pain with urination, but is urinating more frequently.   Used to have heartburn, but not in a while.   NSAIDs: None.    Quit drinking alcohol and smoking tobacco in February.    No medications.  No recent antibiotics.   Did not take antibiotic prescribed by the ER.  No prior EGD or colonoscopy.    History reviewed. No pertinent past medical history.  History reviewed. No pertinent surgical history.  No current outpatient medications on file.   No current facility-administered medications for this visit.    Allergies as of 03/13/2023   (No Known Allergies)    Family History  Problem Relation Age of Onset   Colon cancer Neg Hx    Inflammatory bowel disease Neg Hx     Social History   Socioeconomic History   Marital status: Single    Spouse name: Not on file   Number of children: Not on file   Years of education: Not on file   Highest education level: Not on file  Occupational History   Not on file  Tobacco Use   Smoking status: Former  Types: Cigarettes   Smokeless tobacco: Never  Vaping Use   Vaping status: Never Used  Substance and Sexual Activity   Alcohol use: Not Currently    Comment: Stopped 04/2022   Drug use: Not Currently   Sexual activity: Not Currently  Other Topics Concern   Not on file  Social History Narrative   Not on file   Social Drivers of Health   Financial Resource Strain: Not on file  Food Insecurity: Not on file  Transportation Needs: Not on file  Physical Activity: Not on file  Stress: Not on file  Social Connections: Not on file  Intimate Partner Violence: Not on file    Review of Systems: Gen: Denies any fever,  chills, cold or flu like symptoms, pre-syncope, or syncope.  CV: Denies chest pain, heart palpitations.  Resp: Denies shortness of breath, cough. GI: See HPI GU : See HPI MS: Denies joint pain, muscle weakness, cramps, or limitation of movement.  Derm: Denies rash. Psych: Denies depression, anxiety. Heme: See HPI  Physical Exam: BP 121/78 (BP Location: Right Arm, Patient Position: Sitting, Cuff Size: Large)   Pulse 70   Temp 97.9 F (36.6 C) (Temporal)   Ht 5\' 11"  (1.803 m)   Wt 218 lb 6.4 oz (99.1 kg)   BMI 30.46 kg/m  General:   Alert and oriented. Pleasant and cooperative. Well-nourished and well-developed.  Head:  Normocephalic and atraumatic. Eyes:  Without icterus, sclera clear and conjunctiva pink.  Ears:  Normal auditory acuity. Lungs:  Clear to auscultation bilaterally. No wheezes, rales, or rhonchi. No distress.  Heart:  S1, S2 present without murmurs appreciated.  Abdomen:  +BS, soft, and non-distended.  Moderate tenderness to palpation in the epigastric and RUQ region, mild TTP in right lower quadrant, left lower quadrant, suprapubic region.  No HSM noted. No guarding or rebound. No masses appreciated.  Rectal:  Deferred  Msk:  Symmetrical without gross deformities. Normal posture. Extremities:  Without edema. Neurologic:  Alert and  oriented x4;  grossly normal neurologically. Skin:  Intact without significant lesions or rashes. Psych: Normal mood and affect.    Assessment:  31 year old male with history of E. coli infection in August 2024, presenting today for further evaluation of abdominal pain and change in bowel habits with diarrhea.  Symptoms initially started in August when he was diagnosed with E. coli.  He was treated with azithromycin and reports improvement in diarrhea and abdominal pain, but symptoms never resolved and seem to be worsening over the last couple of weeks.  Evaluation in the ER on 12/19 for the same with no significant abnormalities on CMP or  lipase.  GI panel was normal.    Differential is broad and patient has essentially generalized abdominal pain.  I do not think we are dealing with an infectious diarrhea as he is only having 1 or 2 bowel movements a day.  As he reports increased RUQ abdominal pain postprandially with some associated nausea, query biliary/gallbladder etiology.  Could also have PUD/gastritis/duodenitis contributing to upper abdominal pain. On exam, he also has tenderness in the right lower quadrant, left lower quadrant, and suprapubic region.  Additional differentials include diverticulitis, colitis/IBD.  Less likely appendicitis.   We will update labs, check inflammatory markers, and obtain a CT of his abdomen pelvis to further evaluate his symptoms.  I am also ordering urine analysis as he reports increased urinary frequency and foamy urine.    Plan:  CBC, CMP, lipase, CRP, sed rate, UA with reflex  to culture. CT A/P with oral and IV contrast ASAP Further recommendations to follow.   Ermalinda Memos, PA-C [facility] 03/13/2023

## 2023-03-13 ENCOUNTER — Encounter: Payer: Self-pay | Admitting: *Deleted

## 2023-03-13 ENCOUNTER — Telehealth: Payer: Self-pay | Admitting: *Deleted

## 2023-03-13 ENCOUNTER — Encounter: Payer: Self-pay | Admitting: Gastroenterology

## 2023-03-13 ENCOUNTER — Ambulatory Visit (INDEPENDENT_AMBULATORY_CARE_PROVIDER_SITE_OTHER): Payer: Medicaid Other | Admitting: Gastroenterology

## 2023-03-13 VITALS — BP 121/78 | HR 70 | Temp 97.9°F | Ht 71.0 in | Wt 218.4 lb

## 2023-03-13 DIAGNOSIS — R1084 Generalized abdominal pain: Secondary | ICD-10-CM | POA: Diagnosis not present

## 2023-03-13 DIAGNOSIS — R350 Frequency of micturition: Secondary | ICD-10-CM

## 2023-03-13 DIAGNOSIS — R197 Diarrhea, unspecified: Secondary | ICD-10-CM

## 2023-03-13 DIAGNOSIS — R194 Change in bowel habit: Secondary | ICD-10-CM | POA: Diagnosis not present

## 2023-03-13 NOTE — Telephone Encounter (Signed)
[facility]  CT scheduled for Friday 03/14/23 at [facility], arrive at 9:15 am to start drinking oral contrast.

## 2023-03-13 NOTE — Patient Instructions (Addendum)
Please have blood work completed at [facility]. [address], [place], [place] [address]  We will get you scheduled for a CT of your abdomen and pelvis to further evaluate your abdominal pain.  Will call you with results and further recommendations.  If you have any worsening symptoms, develop fever, chills, please return to the emergency room.  It was good to meet you today!  I am sorry that you are not feeling well.  Ermalinda Memos, PA-C [facility]

## 2023-03-13 NOTE — Telephone Encounter (Signed)
[employer] PA: Authorization Number: [id number] Case Number: [id number] Review Date: 03/13/2023 9:54:46 AM Expiration Date: 04/27/2023 Status: Your case has been Approved.

## 2023-03-13 NOTE — Telephone Encounter (Signed)
Pt came by office and was informed of CT appt date, time and location. Verbalized understanding.

## 2023-03-14 ENCOUNTER — Ambulatory Visit (HOSPITAL_COMMUNITY)
Admission: RE | Admit: 2023-03-14 | Discharge: 2023-03-14 | Disposition: A | Discharge status: Home / Self Care | Payer: Medicaid Other | Source: Ambulatory Visit | Attending: Gastroenterology | Admitting: Gastroenterology

## 2023-03-14 DIAGNOSIS — R194 Change in bowel habit: Secondary | ICD-10-CM | POA: Diagnosis present

## 2023-03-14 DIAGNOSIS — R1084 Generalized abdominal pain: Secondary | ICD-10-CM | POA: Diagnosis present

## 2023-03-14 LAB — URINALYSIS W MICROSCOPIC + REFLEX CULTURE
Bacteria, UA: NONE SEEN /[HPF]
Bilirubin Urine: NEGATIVE
Glucose, UA: NEGATIVE
Hgb urine dipstick: NEGATIVE
Leukocyte Esterase: NEGATIVE
Nitrites, Initial: NEGATIVE
RBC / HPF: NONE SEEN /[HPF] (ref 0–2)
Specific Gravity, Urine: 1.032 (ref 1.001–1.035)
WBC, UA: NONE SEEN /[HPF] (ref 0–5)
pH: 5.5 (ref 5.0–8.0)

## 2023-03-14 LAB — COMPLETE METABOLIC PANEL WITH GFR
AG Ratio: 1.5 (calc) (ref 1.0–2.5)
ALT: 13 U/L (ref 9–46)
AST: 15 U/L (ref 10–40)
Albumin: 4.8 g/dL (ref 3.6–5.1)
Alkaline phosphatase (APISO): 107 U/L (ref 36–130)
BUN: 15 mg/dL (ref 7–25)
CO2: 27 mmol/L (ref 20–32)
Calcium: 9.7 mg/dL (ref 8.6–10.3)
Chloride: 103 mmol/L (ref 98–110)
Creat: 0.9 mg/dL (ref 0.60–1.26)
Globulin: 3.3 g/dL (ref 1.9–3.7)
Glucose, Bld: 93 mg/dL (ref 65–99)
Potassium: 4 mmol/L (ref 3.5–5.3)
Sodium: 139 mmol/L (ref 135–146)
Total Bilirubin: 0.6 mg/dL (ref 0.2–1.2)
Total Protein: 8.1 g/dL (ref 6.1–8.1)
eGFR: 118 mL/min/{1.73_m2} (ref >=60)

## 2023-03-14 LAB — CBC WITH DIFFERENTIAL/PLATELET
Absolute Lymphocytes: 2352 {cells}/uL (ref 850–3900)
Absolute Monocytes: 448 {cells}/uL (ref 200–950)
Basophils Absolute: 21 {cells}/uL (ref 0–200)
Basophils Relative: 0.3 %
Eosinophils Absolute: 112 {cells}/uL (ref 15–500)
Eosinophils Relative: 1.6 %
HCT: 49.1 % (ref 38.5–50.0)
Hemoglobin: 16.2 g/dL (ref 13.2–17.1)
MCH: 29 pg (ref 27.0–33.0)
MCHC: 33 g/dL (ref 32.0–36.0)
MCV: 88 fL (ref 80.0–100.0)
MPV: 12.5 fL (ref 7.5–12.5)
Monocytes Relative: 6.4 %
Neutro Abs: 4067 {cells}/uL (ref 1500–7800)
Neutrophils Relative %: 58.1 %
Platelets: 201 10*3/uL (ref 140–400)
RBC: 5.58 10*6/uL (ref 4.20–5.80)
RDW: 12.1 % (ref 11.0–15.0)
Total Lymphocyte: 33.6 %
WBC: 7 10*3/uL (ref 3.8–10.8)

## 2023-03-14 LAB — LIPASE: Lipase: 22 U/L (ref 7–60)

## 2023-03-14 LAB — C-REACTIVE PROTEIN: CRP: <3 mg/L (ref <8.0)

## 2023-03-14 LAB — SEDIMENTATION RATE: Sed Rate: 2 mm/h (ref 0–15)

## 2023-03-14 LAB — NO CULTURE INDICATED

## 2023-03-14 MED ORDER — IOHEXOL 300 MG/ML  SOLN
100.0000 mL | Freq: Once | INTRAMUSCULAR | Status: AC | PRN
  Administered 2023-03-14: 100 mL via INTRAVENOUS

## 2023-03-19 ENCOUNTER — Other Ambulatory Visit: Payer: Self-pay | Admitting: Gastroenterology

## 2023-03-19 ENCOUNTER — Encounter (HOSPITAL_COMMUNITY): Payer: Self-pay

## 2023-03-19 ENCOUNTER — Emergency Department (HOSPITAL_COMMUNITY)
Admission: EM | Admit: 2023-03-19 | Discharge: 2023-03-19 | Disposition: A | Discharge status: Home / Self Care | Payer: Medicaid Other | Attending: Emergency Medicine | Admitting: Emergency Medicine

## 2023-03-19 ENCOUNTER — Emergency Department (HOSPITAL_COMMUNITY): Payer: Medicaid Other

## 2023-03-19 DIAGNOSIS — R1032 Left lower quadrant pain: Secondary | ICD-10-CM | POA: Diagnosis present

## 2023-03-19 DIAGNOSIS — N50812 Left testicular pain: Secondary | ICD-10-CM | POA: Diagnosis not present

## 2023-03-19 DIAGNOSIS — R1084 Generalized abdominal pain: Secondary | ICD-10-CM

## 2023-03-19 DIAGNOSIS — R194 Change in bowel habit: Secondary | ICD-10-CM

## 2023-03-19 LAB — CBC WITH DIFFERENTIAL/PLATELET
Abs Immature Granulocytes: 0.02 10*3/uL (ref 0.00–0.07)
Basophils Absolute: 0 10*3/uL (ref 0.0–0.1)
Basophils Relative: 0 %
Eosinophils Absolute: 0 10*3/uL (ref 0.0–0.5)
Eosinophils Relative: 1 %
HCT: 45.3 % (ref 39.0–52.0)
Hemoglobin: 15.5 g/dL (ref 13.0–17.0)
Immature Granulocytes: 0 %
Lymphocytes Relative: 26 %
Lymphs Abs: 1.6 10*3/uL (ref 0.7–4.0)
MCH: 29.6 pg (ref 26.0–34.0)
MCHC: 34.2 g/dL (ref 30.0–36.0)
MCV: 86.5 fL (ref 80.0–100.0)
Monocytes Absolute: 0.4 10*3/uL (ref 0.1–1.0)
Monocytes Relative: 6 %
Neutro Abs: 4.2 10*3/uL (ref 1.7–7.7)
Neutrophils Relative %: 67 %
Platelets: 199 10*3/uL (ref 150–400)
RBC: 5.24 MIL/uL (ref 4.22–5.81)
RDW: 11.9 % (ref 11.5–15.5)
WBC: 6.3 10*3/uL (ref 4.0–10.5)
nRBC: 0 % (ref 0.0–0.2)

## 2023-03-19 LAB — COMPREHENSIVE METABOLIC PANEL
ALT: 19 U/L (ref 0–44)
AST: 18 U/L (ref 15–41)
Albumin: 4.4 g/dL (ref 3.5–5.0)
Alkaline Phosphatase: 80 U/L (ref 38–126)
Anion gap: 8 (ref 5–15)
BUN: 13 mg/dL (ref 6–20)
CO2: 24 mmol/L (ref 22–32)
Calcium: 9.3 mg/dL (ref 8.9–10.3)
Chloride: 104 mmol/L (ref 98–111)
Creatinine, Ser: 0.76 mg/dL (ref 0.61–1.24)
GFR, Estimated: >60 mL/min (ref >60)
Glucose, Bld: 101 mg/dL — ABNORMAL HIGH (ref 70–99)
Potassium: 4 mmol/L (ref 3.5–5.1)
Sodium: 136 mmol/L (ref 135–145)
Total Bilirubin: 0.7 mg/dL (ref 0.0–1.2)
Total Protein: 7.7 g/dL (ref 6.5–8.1)

## 2023-03-19 LAB — URINALYSIS, ROUTINE W REFLEX MICROSCOPIC
Bilirubin Urine: NEGATIVE
Glucose, UA: NEGATIVE mg/dL
Hgb urine dipstick: NEGATIVE
Ketones, ur: NEGATIVE mg/dL
Leukocytes,Ua: NEGATIVE
Nitrite: NEGATIVE
Protein, ur: NEGATIVE mg/dL
Specific Gravity, Urine: 1.014 (ref 1.005–1.030)
pH: 7 (ref 5.0–8.0)

## 2023-03-19 LAB — LIPASE, BLOOD: Lipase: 31 U/L (ref 11–51)

## 2023-03-19 MED ORDER — PANTOPRAZOLE SODIUM 40 MG PO TBEC: 40.0000 mg | DELAYED_RELEASE_TABLET | Freq: Every day | ORAL | 3 refills | Status: DC

## 2023-03-19 MED ORDER — DICYCLOMINE HCL 10 MG PO CAPS: ORAL_CAPSULE | ORAL | 0 refills | Status: DC

## 2023-03-19 MED ORDER — IBUPROFEN 800 MG PO TABS: 800.0000 mg | ORAL_TABLET | Freq: Three times a day (TID) | ORAL | 0 refills | Status: DC

## 2023-03-19 NOTE — ED Provider Notes (Signed)
 [place] EMERGENCY DEPARTMENT AT [facility] Provider Note   CSN: [id number] Arrival date & time: 03/19/23  1012     History  Chief Complaint  Patient presents with   Testicle Pain    Edgar Parker is a 33 y.o. male.   Testicle Pain Associated symptoms include abdominal pain. Pertinent negatives include no chest pain and no shortness of breath.       Edgar Parker is a 33 y.o. male who presents to the Emergency Department complaining of left testicle pain x 1 week.  He was seen by GI on 03/13/2023 and evaluated for diarrhea and generalized abdominal pain.  CT was performed without acute findings.  He has been experiencing some discomfort of his left testicle and feels that it is swollen.  At times, he feels it is drawing or pulling upward symptoms improved with support.  No known injury.  He states he has had ongoing abdominal pain and diarrhea since the summer, but testicle symptoms are new.  Denies any dysuria, penile discharge or new sexual partners.  No flank pain or history of kidney stones.  Home Medications Prior to Admission medications   Not on File      Allergies    Patient has no known allergies.    Review of Systems   Review of Systems  Constitutional:  Negative for chills and fever.  Respiratory:  Negative for shortness of breath.   Cardiovascular:  Negative for chest pain.  Gastrointestinal:  Positive for abdominal pain and diarrhea. Negative for nausea and vomiting.  Genitourinary:  Positive for scrotal swelling and testicular pain. Negative for decreased urine volume, difficulty urinating, flank pain, frequency, penile pain and penile swelling.  Skin:  Negative for rash.  Neurological:  Negative for weakness and numbness.    Physical Exam Updated Vital Signs BP 137/83 (BP Location: Left Arm)   Pulse 80   Temp 98 F (36.7 C) (Oral)   Resp 17   Ht 5' 11 (1.803 m)   Wt 98.9 kg   SpO2 98%   BMI 30.40 kg/m  Physical Exam Vitals and  nursing note reviewed. Exam conducted with a chaperone present.  Constitutional:      General: He is not in acute distress.    Appearance: Normal appearance. He is not ill-appearing or toxic-appearing.  Cardiovascular:     Rate and Rhythm: Normal rate and regular rhythm.     Pulses: Normal pulses.  Pulmonary:     Effort: Pulmonary effort is normal.  Abdominal:     General: There is no distension.     Palpations: Abdomen is soft.     Tenderness: There is abdominal tenderness. There is no right CVA tenderness, left CVA tenderness or guarding.     Hernia: There is no hernia in the left inguinal area.     Comments: Mild left lower quadrant tenderness.  No guarding or rebound.  Abdomen is soft  Genitourinary:    Penis: No phimosis, paraphimosis or discharge.      Testes: Cremasteric reflex is present.        Left: Tenderness present. Mass or swelling not present. Left testis is descended. Cremasteric reflex is present.      Epididymis:     Left: Not enlarged. No tenderness.     Comments: Mild tenderness on exam of left testicle.  I do not appreciate significant edema, no rash.  No palpable inguinal hernias. Exam was chaperoned by nursing staff Musculoskeletal:  General: Normal range of motion.  Lymphadenopathy:     Lower Body: No left inguinal adenopathy.  Skin:    General: Skin is warm.     Capillary Refill: Capillary refill takes less than 2 seconds.  Neurological:     General: No focal deficit present.     Mental Status: He is alert.     Sensory: No sensory deficit.     Motor: No weakness.     ED Results / Procedures / Treatments   Labs (all labs ordered are listed, but only abnormal results are displayed) Labs Reviewed  COMPREHENSIVE METABOLIC PANEL - Abnormal; Notable for the following components:      Result Value   Glucose, Bld 101 (*)    All other components within normal limits  URINALYSIS, ROUTINE W REFLEX MICROSCOPIC  CBC WITH DIFFERENTIAL/PLATELET  LIPASE,  BLOOD    EKG None  Radiology US  SCROTUM W/DOPPLER Result Date: 03/19/2023 CLINICAL DATA:  Left testicular pain. EXAM: SCROTAL ULTRASOUND DOPPLER ULTRASOUND OF THE TESTICLES TECHNIQUE: Complete ultrasound examination of the testicles, epididymis, and other scrotal structures was performed. Color and spectral Doppler ultrasound were also utilized to evaluate blood flow to the testicles. COMPARISON:  None Available. FINDINGS: Right testicle Measurements: 4.9 x 2.4 x 3.0 cm. No mass or microlithiasis visualized. Left testicle Measurements: 5.0 x 2.2 x 3.0 cm. No mass or microlithiasis visualized. Right epididymis: Tiny epididymal cyst or spermatocele measuring up to 4 mm. Left epididymis: Tiny epididymal cyst or spermatocele measuring 7 mm. Hydrocele:  None visualized. Varicocele:  None visualized. Pulsed Doppler interrogation of both testes demonstrates normal low resistance arterial and venous waveforms bilaterally. IMPRESSION: 1. No evidence for testicular mass or torsion. 2. Tiny bilateral epididymal cysts or spermatoceles. Electronically Signed   By: Camellia Candle M.D.   On: 03/19/2023 12:52    Procedures Procedures    Medications Ordered in ED Medications - No data to display  ED Course/ Medical Decision Making/ A&P                                 Medical Decision Making Patient here for evaluation of left testicular pain x 1 week.  Also has history of ongoing abdominal pain with diarrhea.  Has recently seen GI and had CT of the abdomen pelvis without clear cause of patient's symptoms.  Began having testicular pain 1 week ago.  Denies known injury, penile discharge or new sexual partner.  Pt well appearing on my exam.  No distress or exquisite tenderness on exam.  Possible STI, epididymitis, hydrocele, torsion considered as well but felt less likely  Amount and/or Complexity of Data Reviewed Labs: ordered.    Details: unremarkable Radiology: ordered.    Details: US  torsion r/o w/o  evidence for torsion or mass Discussion of management or test interpretation with external provider(s): Cause of pt's sx's unclear, but doubt emergent process.  GC and Chlamydia culture pending.  Pt doubts STI.  He appears appropriate for d/c home.  I have recommended scrotal support and close out patient f/u with urology.  Return precautions also given  Risk Prescription drug management.           Final Clinical Impression(s) / ED Diagnoses Final diagnoses:  Pain in left testicle    Rx / DC Orders ED Discharge Orders     None         Herlinda Milling, PA-C 03/21/23 1344    Cleotilde Rogue,  MD 03/21/23 2010

## 2023-03-19 NOTE — ED Triage Notes (Signed)
 Pt arrives via POV with c/o testicle pain x 1 week, states he had a urinalysis done recently and CT scan last week. Denies dysuria, states he sometimes has cold sweats at night.

## 2023-03-19 NOTE — Discharge Instructions (Signed)
 As discussed, recommend wearing briefs instead of boxers to help with scrotal support.  Avoid heavy lifting or squatting when possible.  Call the urologist listed to arrange follow-up appointment.

## 2023-03-20 ENCOUNTER — Encounter: Payer: Self-pay | Admitting: *Deleted

## 2023-03-20 LAB — GC/CHLAMYDIA PROBE AMP (~~LOC~~) NOT AT ARMC
Chlamydia: NEGATIVE
Comment: NEGATIVE
Comment: NORMAL
Neisseria Gonorrhea: NEGATIVE

## 2023-04-08 NOTE — Telephone Encounter (Signed)
[facility] for EGD: Status:APPROVED Authorization #: [id number]  DOS: Apr 10, 2023 - Jul 09, 2023

## 2023-04-10 ENCOUNTER — Ambulatory Visit (HOSPITAL_COMMUNITY)
Admission: RE | Admit: 2023-04-10 | Discharge: 2023-04-10 | Disposition: A | Discharge status: Home / Self Care | Payer: Medicaid Other | Attending: Internal Medicine | Admitting: Internal Medicine

## 2023-04-10 ENCOUNTER — Other Ambulatory Visit: Payer: Self-pay

## 2023-04-10 ENCOUNTER — Encounter (HOSPITAL_COMMUNITY): Payer: Self-pay | Admitting: Internal Medicine

## 2023-04-10 ENCOUNTER — Ambulatory Visit (HOSPITAL_COMMUNITY): Payer: Medicaid Other | Admitting: Anesthesiology

## 2023-04-10 ENCOUNTER — Ambulatory Visit (HOSPITAL_BASED_OUTPATIENT_CLINIC_OR_DEPARTMENT_OTHER): Payer: Medicaid Other | Admitting: Anesthesiology

## 2023-04-10 ENCOUNTER — Encounter (HOSPITAL_COMMUNITY)
Admission: RE | Disposition: A | Discharge status: Home / Self Care | Payer: Self-pay | Source: Home / Self Care | Attending: Internal Medicine

## 2023-04-10 DIAGNOSIS — R110 Nausea: Secondary | ICD-10-CM | POA: Insufficient documentation

## 2023-04-10 DIAGNOSIS — R1084 Generalized abdominal pain: Secondary | ICD-10-CM | POA: Diagnosis not present

## 2023-04-10 DIAGNOSIS — Z87891 Personal history of nicotine dependence: Secondary | ICD-10-CM | POA: Diagnosis not present

## 2023-04-10 DIAGNOSIS — R1314 Dysphagia, pharyngoesophageal phase: Secondary | ICD-10-CM | POA: Insufficient documentation

## 2023-04-10 DIAGNOSIS — I10 Essential (primary) hypertension: Secondary | ICD-10-CM | POA: Diagnosis not present

## 2023-04-10 DIAGNOSIS — Z8619 Personal history of other infectious and parasitic diseases: Secondary | ICD-10-CM | POA: Insufficient documentation

## 2023-04-10 DIAGNOSIS — R1010 Upper abdominal pain, unspecified: Secondary | ICD-10-CM

## 2023-04-10 DIAGNOSIS — R197 Diarrhea, unspecified: Secondary | ICD-10-CM | POA: Insufficient documentation

## 2023-04-10 DIAGNOSIS — R1310 Dysphagia, unspecified: Secondary | ICD-10-CM | POA: Diagnosis not present

## 2023-04-10 HISTORY — PX: ESOPHAGOGASTRODUODENOSCOPY (EGD) WITH PROPOFOL: SHX5813

## 2023-04-10 HISTORY — PX: MALONEY DILATION: SHX5535

## 2023-04-10 SURGERY — ESOPHAGOGASTRODUODENOSCOPY (EGD) WITH PROPOFOL
Anesthesia: General

## 2023-04-10 MED ORDER — LIDOCAINE HCL (PF) 2 % IJ SOLN
INTRAMUSCULAR | Status: DC | PRN
  Administered 2023-04-10: 100 mg via INTRADERMAL

## 2023-04-10 MED ORDER — DEXMEDETOMIDINE HCL IN NACL 80 MCG/20ML IV SOLN
INTRAVENOUS | Status: DC | PRN
  Administered 2023-04-10: 12 ug via INTRAVENOUS
  Administered 2023-04-10: 8 ug via INTRAVENOUS

## 2023-04-10 MED ORDER — LACTATED RINGERS IV SOLN: INTRAVENOUS | Status: DC

## 2023-04-10 MED ORDER — STERILE WATER FOR IRRIGATION IR SOLN
Status: DC | PRN
  Administered 2023-04-10: 120 mL

## 2023-04-10 MED ORDER — PROPOFOL 500 MG/50ML IV EMUL
INTRAVENOUS | Status: DC | PRN
  Administered 2023-04-10: 250 ug/kg/min via INTRAVENOUS

## 2023-04-10 MED ORDER — PROPOFOL 10 MG/ML IV BOLUS
INTRAVENOUS | Status: DC | PRN
  Administered 2023-04-10: 100 mg via INTRAVENOUS
  Administered 2023-04-10: 50 mg via INTRAVENOUS

## 2023-04-10 NOTE — Interval H&P Note (Signed)
History and Physical Interval Note:  04/10/2023 9:02 AM  Edgar Parker  has presented today for surgery, with the diagnosis of upper abdominal pain, nausea..  The various methods of treatment have been discussed with the patient and family. After consideration of risks, benefits and other options for treatment, the patient has consented to  Procedure(s) with comments: ESOPHAGOGASTRODUODENOSCOPY (EGD) WITH PROPOFOL (N/A) - 9:00 am, asa 2 as a surgical intervention.  The patient's history has been reviewed, patient examined, no change in status, stable for surgery.  I have reviewed the patient's chart and labs.  Questions were answered to the patient's satisfaction.     Molly Maduro Davonn Flanery    CT negative labs unremarkable.  Overall improved from presentation in the office.  Some vague esophageal dysphagia.  Acid suppression antispasmodics have helped labs unremarkable here for EGD to further evaluate possible esophageal dilation as discussed with patient in preop.  Marland Kitchenrmr

## 2023-04-10 NOTE — Transfer of Care (Signed)
Immediate Anesthesia Transfer of Care Note  Patient: Edgar Parker  Procedure(s) Performed: ESOPHAGOGASTRODUODENOSCOPY (EGD) WITH PROPOFOL MALONEY DILATION  Patient Location: Endoscopy Unit  Anesthesia Type:General  Level of Consciousness: awake  Airway & Oxygen Therapy: Patient Spontanous Breathing  Post-op Assessment: Report given to RN  Post vital signs: Reviewed and stable  Last Vitals:  Vitals Value Taken Time  BP    Temp    Pulse    Resp    SpO2      Last Pain:  Vitals:   04/10/23 0910  TempSrc:   PainSc: 0-No pain      Patients Stated Pain Goal: 6 (04/10/23 0735)  Complications: No notable events documented.

## 2023-04-10 NOTE — Anesthesia Postprocedure Evaluation (Signed)
Anesthesia Post Note  Patient: Edgar Parker  Procedure(s) Performed: ESOPHAGOGASTRODUODENOSCOPY (EGD) WITH PROPOFOL MALONEY DILATION  Patient location during evaluation: Short Stay Anesthesia Type: General Level of consciousness: awake and alert Pain management: pain level controlled Vital Signs Assessment: post-procedure vital signs reviewed and stable Respiratory status: spontaneous breathing Cardiovascular status: blood pressure returned to baseline and stable Postop Assessment: no apparent nausea or vomiting Anesthetic complications: no   No notable events documented.   Last Vitals:  Vitals:   04/10/23 0735  BP: 115/73  Pulse: 66  Resp: 18  Temp: 36.7 C  SpO2: 99%    Last Pain:  Vitals:   04/10/23 0910  TempSrc:   PainSc: 0-No pain                 Sabatino Williard

## 2023-04-10 NOTE — Op Note (Signed)
[facility] Patient Name: Edgar Parker Procedure Date: 04/10/2023 8:49 AM MRN: [id number] Date of Birth: 30-Jun-1990 Attending MD: Gennette Pac , MD, [id number] CSN: [id number] Age: 33 Admit Type: Outpatient Procedure:                Upper GI endoscopy Indications:              Dysphagia Providers:                Gennette Pac, MD, Crystal Page, Lennice Sites                            Technician, Technician Referring MD:             Gennette Pac, MD Medicines:                Propofol per Anesthesia Complications:            No immediate complications. Estimated Blood Loss:     Estimated blood loss: none. Procedure:                Pre-Anesthesia Assessment:                           - Prior to the procedure, a History and Physical                            was performed, and patient medications and                            allergies were reviewed. The patient's tolerance of                            previous anesthesia was also reviewed. The risks                            and benefits of the procedure and the sedation                            options and risks were discussed with the patient.                            All questions were answered, and informed consent                            was obtained. Prior Anticoagulants: The patient has                            taken no anticoagulant or antiplatelet agents. ASA                            Grade Assessment: II - A patient with mild systemic                            disease. After reviewing the risks and benefits,  the patient was deemed in satisfactory condition to                            undergo the procedure.                           After obtaining informed consent, the endoscope was                            passed under direct vision. Throughout the                            procedure, the patient's blood pressure, pulse, and                             oxygen saturations were monitored continuously. The                            [id number]-[id number] ([id number]) scope was introduced through the                            mouth, and advanced to the second part of duodenum.                            The upper GI endoscopy was accomplished without                            difficulty. The patient tolerated the procedure                            well. Scope In: 9:14:27 AM Scope Out: 9:21:30 AM Total Procedure Duration: 0 hours 7 minutes 3 seconds  Findings:      The examined esophagus was normal.      The entire examined stomach was normal.      The duodenal bulb and second portion of the duodenum were normal. The       scope was withdrawn. Dilation was performed with a Maloney dilator with       mild resistance at 56 Fr. The dilation site was examined following       endoscope reinsertion and showed no change. Estimated blood loss: none. Impression:               - Normal esophagus. Dilated.                           - Normal stomach.                           - Normal duodenal bulb and second portion of the                            duodenum.                           - No specimens collected. No explanation for  symptoms found on today's exam. Symptoms overall                            improving. Moderate Sedation:      Moderate (conscious) sedation was personally administered by an       anesthesia professional. The following parameters were monitored: oxygen       saturation, heart rate, blood pressure, respiratory rate, EKG, adequacy       of pulmonary ventilation, and response to care. Recommendation:           - Patient has a contact number available for                            emergencies. The signs and symptoms of potential                            delayed complications were discussed with the                            patient. Return to normal activities tomorrow.                            Written  discharge instructions were provided to the                            patient.                           - Advance diet as tolerated.                           - Continue present medications. Gallbladder                            ultrasound to rule out occult stones                           - Return to my office in 3 weeks. Procedure Code(s):        --- Professional ---                           [phone], Esophagogastroduodenoscopy, flexible,                            transoral; diagnostic, including collection of                            specimen(s) by brushing or washing, when performed                            (separate procedure)                           43450, Dilation of esophagus, by unguided sound or  bougie, single or multiple passes Diagnosis Code(s):        --- Professional ---                           R13.10, Dysphagia, unspecified CPT copyright 2022 American Medical Association. All rights reserved. The codes documented in this report are preliminary and upon coder review may  be revised to meet current compliance requirements. Gerrit Friends. Lorren Rossetti, MD Gennette Pac, MD 04/10/2023 9:33:56 AM This report has been signed electronically. Number of Addenda: 0

## 2023-04-10 NOTE — Anesthesia Procedure Notes (Signed)
Date/Time: 04/10/2023 9:09 AM  Performed by: Julian Reil, CRNAPre-anesthesia Checklist: Emergency Drugs available, Suction available, Patient being monitored and Patient identified Patient Re-evaluated:Patient Re-evaluated prior to induction Oxygen Delivery Method: Nasal cannula Induction Type: IV induction Placement Confirmation: positive ETCO2

## 2023-04-10 NOTE — Anesthesia Preprocedure Evaluation (Signed)

## 2023-04-10 NOTE — Discharge Instructions (Addendum)
EGD Discharge instructions Please read the instructions outlined below and refer to this sheet in the next few weeks. These discharge instructions provide you with general information on caring for yourself after you leave the hospital. Your doctor may also give you specific instructions. While your treatment has been planned according to the most current medical practices available, unavoidable complications occasionally occur. If you have any problems or questions after discharge, please call your doctor. ACTIVITY You may resume your regular activity but move at a slower pace for the next 24 hours.  Take frequent rest periods for the next 24 hours.  Walking will help expel (get rid of) the air and reduce the bloated feeling in your abdomen.  No driving for 24 hours (because of the anesthesia (medicine) used during the test).  You may shower.  Do not sign any important legal documents or operate any machinery for 24 hours (because of the anesthesia used during the test).  NUTRITION Drink plenty of fluids.  You may resume your normal diet.  Begin with a light meal and progress to your normal diet.  Avoid alcoholic beverages for 24 hours or as instructed by your caregiver.  MEDICATIONS You may resume your normal medications unless your caregiver tells you otherwise.  WHAT YOU CAN EXPECT TODAY You may experience abdominal discomfort such as a feeling of fullness or "gas" pains.  FOLLOW-UP Your doctor will discuss the results of your test with you.  SEEK IMMEDIATE MEDICAL ATTENTION IF ANY OF THE FOLLOWING OCCUR: Excessive nausea (feeling sick to your stomach) and/or vomiting.  Severe abdominal pain and distention (swelling).  Trouble swallowing.  Temperature over 101 F (37.8 C).  Rectal bleeding or vomiting of blood.       Your upper GI tract appeared normal.  I did stretch your esophagus    continue current medications    because of upper abdominal pain and we should get an  gallbladder ultrasound as gallstones can be missed CT scan     so, RUQ ultrasound to rule out occult gallstone disease    Office visit with Ermalinda Memos in 3 to 4 weeks  OFFICE TO CALL WITH APPOINTMENT

## 2023-04-11 ENCOUNTER — Encounter (HOSPITAL_COMMUNITY): Payer: Self-pay | Admitting: Internal Medicine

## 2023-05-08 ENCOUNTER — Encounter (INDEPENDENT_AMBULATORY_CARE_PROVIDER_SITE_OTHER): Payer: Self-pay

## 2023-05-08 ENCOUNTER — Ambulatory Visit: Payer: Medicaid Other | Admitting: Gastroenterology

## 2023-05-09 ENCOUNTER — Telehealth: Payer: Self-pay | Admitting: Gastroenterology

## 2023-05-09 NOTE — Telephone Encounter (Signed)
 Left pt a message to reschedule appt from 05/08/23 with Ermalinda Memos.

## 2023-09-20 NOTE — Progress Notes (Unsigned)
 Referring Provider: No ref. provider found Primary Care Physician:  Pcp, No Primary GI Physician: Dr. Shaaron  Chief Complaint  Patient presents with   Follow-up    Follow up. No problems     HPI:   Edgar Parker is a 33 y.o. male with history of E. coli infection in August 2024, presenting today for follow-up of abdominal pain and loose stool.  Last seen in the office 03/13/2023 for evaluation of abdominal pain and change in bowel habits with diarrhea.  Symptoms initially started in August when he was diagnosed with E. coli.  He was treated with azithromycin  with improvement in diarrhea and abdominal pain, but symptoms never resolved and seemed to be worsening over the last couple of weeks.  Evaluated in the ER on 12/19 with no significant abnormalities on CMP or lipase.  GI panel was normal.  Doubt infectious diarrhea as he was only having 1 or 2 bowel movements per day.  As he reported increased RUQ abdominal pain postprandially with some associated nausea, query biliary/gallbladder etiology.  Noted he could also have PUD/gastritis/duodenitis contributing to upper abdominal pain. Felt symptoms less likely to be diverticulitis, colitis/IBD.  Plan for labs, CT, and further recommendations to follow.  Labs 03/13/2023 with CBC, CMP, lipase, sed rate, CRP all within normal limits.  CT A/P with contrast 03/14/2023 with no acute abnormalities.  He did have mild thickened appearance of several segments of small bowel and terminal ileum likely related to underdistention.  No associated inflammatory changes.  Recommended starting dicyclomine , EGD to evaluate upper abdominal pain and nausea, start Protonix  40 mg daily.  EGD 04/10/2023 with normal exam s/p empiric esophageal dilation.  Recommended RUQ ultrasound to evaluate gallbladder, return to GI clinic in 3 weeks.  Today: Having some problems with greasy stools. Dicyclomine  didn't change anything. Symptoms overall improved, but not quite rite.  Feels really full in the upper abdomen after eating even when eating small amounts, so he has not been eating much. Wonders if he has gallbladder issues and states that is why he came to the office today, wants to schedule gallbladder ultrasound that was previously recommended. No nausea or vomiting.  Stools are loose. Usually 1-2 BMs per day.   Upper abdominal discomfort prior to a BM that improves after a BM.   Upper abdominal pain and bowel movements worsen with greasy items.   No change in his symptoms with pantoprazole , so he stopped taking it.  Losing weight due to not eating as much.   Wt Readings from Last 5 Encounters:  09/22/23 217 lb (98.4 kg)  04/10/23 230 lb (104.3 kg)  03/19/23 218 lb (98.9 kg)  03/13/23 218 lb 6.4 oz (99.1 kg)  08/11/22 210 lb (95.3 kg)    History reviewed. No pertinent past medical history.  Past Surgical History:  Procedure Laterality Date   ESOPHAGOGASTRODUODENOSCOPY (EGD) WITH PROPOFOL  N/A 04/10/2023   Surgeon: [name], MD;  normal exam s/p empiric esophageal dilation.   MALONEY DILATION  04/10/2023   Procedure: MALONEY DILATION;  Surgeon: [name], MD;  Location: [facility];  Service: Endoscopy;;    No current outpatient medications on file.   No current facility-administered medications for this visit.    Allergies as of 09/22/2023   (No Known Allergies)    Family History  Problem Relation Age of Onset   Colon cancer Neg Hx    Inflammatory bowel disease Neg Hx     Social History   Socioeconomic  History   Marital status: Single    Spouse name: Not on file   Number of children: Not on file   Years of education: Not on file   Highest education level: Not on file  Occupational History   Not on file  Tobacco Use   Smoking status: Former    Types: Cigarettes   Smokeless tobacco: Never  Vaping Use   Vaping status: Never Used  Substance and Sexual Activity   Alcohol use: Not Currently    Comment: Stopped  04/2022   Drug use: Not Currently   Sexual activity: Not Currently  Other Topics Concern   Not on file  Social History Narrative   Not on file   Social Drivers of Health   Financial Resource Strain: Not on file  Food Insecurity: Not on file  Transportation Needs: Not on file  Physical Activity: Not on file  Stress: Not on file  Social Connections: Not on file    Review of Systems: Gen: Denies fever, chills, cold or flulike symptoms, presyncope, syncope. CV: Denies chest pain, palpitations.  Resp: Denies dyspnea, cough. GI: See HPI Heme: See HPI  Physical Exam: BP 114/72 (BP Location: Right Arm, Patient Position: Sitting, Cuff Size: Large)   Pulse 60   Temp 97.9 F (36.6 C) (Temporal)   Ht 5' 11 (1.803 m)   Wt 217 lb (98.4 kg)   BMI 30.27 kg/m  General:   Alert and oriented. No distress noted. Pleasant and cooperative.  Head:  Normocephalic and atraumatic. Eyes:  Conjuctiva clear without scleral icterus. Heart:  S1, S2 present without murmurs appreciated. Lungs:  Clear to auscultation bilaterally. No wheezes, rales, or rhonchi. No distress.  Abdomen:  +BS, soft, non-tender and non-distended. No rebound or guarding. No HSM or masses noted. Msk:  Symmetrical without gross deformities. Normal posture. Extremities:  Without edema. Neurologic:  Alert and  oriented x4 Psych:  Normal mood and affect.    Assessment:  33 year old male with history of E. coli in August 2024, presenting today for follow-up of abdominal pain and loose stool.  Upper abdominal pain/early satiety: Greater than 73-month history of postprandial upper abdominal pain/early satiety without any significant nausea or vomiting.  Prior laboratory evaluation for the same in December with no acute abnormalities.  CT A/P with contrast December 2024 with no acute abnormalities.  EGD January 2025 with normal exam.  He has had no improvement in symptoms with pantoprazole  or dicyclomine .  Query biliary etiology as  patient reports symptoms are worse with greasy items, possibly biliary colic versus biliary dyskinesia.  He has no real risk factors for gastroparesis, but this does remain in the differential, could be postinfectious swallowing E. coli. Recommend starting with RUQ ultrasound.   Weight loss:  Patient reported weight loss secondary to decreased p.o. intake, dietary changes/healthier eating in the setting of upper abdominal pain/early satiety.  Per chart review, appears that there is an outlier of 230 lbson April 10, 2023, but all other weights dating back to his last office visit with me in December 2024 are either 217 or 218 lbs.  Based on this, I am not sure that the weight recorded January 23 was accurate as his weight on January 1 was also 218 pounds.   Loose stool: 1-2 loose bowel movements per day.  Some upper abdominal discomfort prior to a bowel movement that improves thereafter.  No improvement with dicyclomine .  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 coli infection in August  2024.  Gallbladder dysfunction could also be playing a role.  He has no BRBPR or melena and prior sed rate and CRP within normal limits in December 2024.  Abdominal exam is benign today.  No inflammatory changes on prior CT.  Based on labs and imaging, I do not think were dealing with IBD.   Plan to evaluate his upper abdominal pain first as this is his primary concern.  Could consider course of Xifaxan for possible IBS if gallbladder doesn't appear to be causing him any issues.   Plan:  RUQ US  Further recommendations to follow.  If RUQ ultrasound is unrevealing, would consider HIDA and gastric emptying study.  Could consider course of Xifaxan for IBS, but holding off on this while evaluating upper abdominal pain.    Josette Centers, PA-C [facility] 09/22/2023

## 2023-09-22 ENCOUNTER — Encounter: Payer: Self-pay | Admitting: Gastroenterology

## 2023-09-22 ENCOUNTER — Encounter: Payer: Self-pay | Admitting: *Deleted

## 2023-09-22 ENCOUNTER — Ambulatory Visit (INDEPENDENT_AMBULATORY_CARE_PROVIDER_SITE_OTHER): Admitting: Gastroenterology

## 2023-09-22 VITALS — BP 114/72 | HR 60 | Temp 97.9°F | Ht 71.0 in | Wt 217.0 lb

## 2023-09-22 DIAGNOSIS — R6881 Early satiety: Secondary | ICD-10-CM

## 2023-09-22 DIAGNOSIS — R1010 Upper abdominal pain, unspecified: Secondary | ICD-10-CM

## 2023-09-22 DIAGNOSIS — R634 Abnormal weight loss: Secondary | ICD-10-CM

## 2023-09-22 DIAGNOSIS — R195 Other fecal abnormalities: Secondary | ICD-10-CM | POA: Diagnosis not present

## 2023-09-22 NOTE — Patient Instructions (Signed)
 Will get you scheduled for an ultrasound of your gallbladder at [facility].  We will reach out to you with your results and further recommendations.  Josette Centers, PA-C [facility]

## 2023-09-23 ENCOUNTER — Encounter: Payer: Self-pay | Admitting: Gastroenterology

## 2023-09-26 ENCOUNTER — Ambulatory Visit (HOSPITAL_COMMUNITY): Attending: Gastroenterology
